# Patient Record
Sex: Female | Born: 1965
Health system: Southern US, Community
[De-identification: ages and names within clinical notes are randomized; demographics above are authoritative.]

## PROBLEM LIST (undated history)

## (undated) DIAGNOSIS — M8580 Other specified disorders of bone density and structure, unspecified site: Secondary | ICD-10-CM

## (undated) HISTORY — DX: Other specified disorders of bone density and structure, unspecified site: M85.80

---

## 2017-11-02 DIAGNOSIS — Z0000 Encounter for general adult medical examination without abnormal findings: Secondary | ICD-10-CM | POA: Diagnosis not present

## 2017-11-02 DIAGNOSIS — Z13220 Encounter for screening for lipoid disorders: Secondary | ICD-10-CM | POA: Diagnosis not present

## 2017-11-20 DIAGNOSIS — S2020XA Contusion of thorax, unspecified, initial encounter: Secondary | ICD-10-CM | POA: Diagnosis not present

## 2017-12-06 DIAGNOSIS — K625 Hemorrhage of anus and rectum: Secondary | ICD-10-CM | POA: Diagnosis not present

## 2017-12-06 DIAGNOSIS — D126 Benign neoplasm of colon, unspecified: Secondary | ICD-10-CM | POA: Diagnosis not present

## 2017-12-06 DIAGNOSIS — Z800 Family history of malignant neoplasm of digestive organs: Secondary | ICD-10-CM | POA: Diagnosis not present

## 2018-03-06 ENCOUNTER — Other Ambulatory Visit: Payer: Self-pay | Admitting: Obstetrics and Gynecology

## 2018-03-06 ENCOUNTER — Other Ambulatory Visit (HOSPITAL_COMMUNITY)
Admission: RE | Admit: 2018-03-06 | Discharge: 2018-03-06 | Disposition: A | Discharge status: Home / Self Care | Payer: 59 | Source: Ambulatory Visit | Attending: Obstetrics and Gynecology | Admitting: Obstetrics and Gynecology

## 2018-03-06 DIAGNOSIS — Z01411 Encounter for gynecological examination (general) (routine) with abnormal findings: Secondary | ICD-10-CM | POA: Diagnosis present

## 2018-03-07 LAB — CYTOLOGY - PAP
DIAGNOSIS: NEGATIVE
HPV: NOT DETECTED

## 2019-03-09 ENCOUNTER — Other Ambulatory Visit: Payer: Self-pay | Admitting: Obstetrics and Gynecology

## 2019-03-09 DIAGNOSIS — M8580 Other specified disorders of bone density and structure, unspecified site: Secondary | ICD-10-CM

## 2019-03-09 DIAGNOSIS — Z1231 Encounter for screening mammogram for malignant neoplasm of breast: Secondary | ICD-10-CM

## 2019-05-17 ENCOUNTER — Ambulatory Visit: Payer: 59

## 2019-05-17 ENCOUNTER — Other Ambulatory Visit: Payer: 59

## 2019-07-04 ENCOUNTER — Other Ambulatory Visit: Payer: 59

## 2019-07-04 ENCOUNTER — Ambulatory Visit: Payer: 59

## 2020-02-21 ENCOUNTER — Other Ambulatory Visit: Payer: Self-pay | Admitting: Family Medicine

## 2020-02-21 DIAGNOSIS — Z1231 Encounter for screening mammogram for malignant neoplasm of breast: Secondary | ICD-10-CM

## 2020-02-21 DIAGNOSIS — M8580 Other specified disorders of bone density and structure, unspecified site: Secondary | ICD-10-CM

## 2020-03-06 ENCOUNTER — Other Ambulatory Visit: Payer: Self-pay

## 2020-03-06 ENCOUNTER — Ambulatory Visit
Admission: RE | Admit: 2020-03-06 | Discharge: 2020-03-06 | Disposition: A | Discharge status: Home / Self Care | Payer: 59 | Source: Ambulatory Visit | Attending: Family Medicine | Admitting: Family Medicine

## 2020-03-06 DIAGNOSIS — Z1231 Encounter for screening mammogram for malignant neoplasm of breast: Secondary | ICD-10-CM

## 2020-05-16 ENCOUNTER — Other Ambulatory Visit: Payer: 59

## 2020-09-04 ENCOUNTER — Other Ambulatory Visit: Payer: 59

## 2021-05-16 IMAGING — MG DIGITAL SCREENING BILAT W/ TOMO W/ CAD
8 series · 8 of 24 positions shown · non-contrast
Comparison: None.
COMPARISON: None.

Addendum:
CLINICAL DATA: Screening.

EXAM:
DIGITAL SCREENING BILATERAL MAMMOGRAM WITH TOMO AND CAD

[L MLO synth-2D]
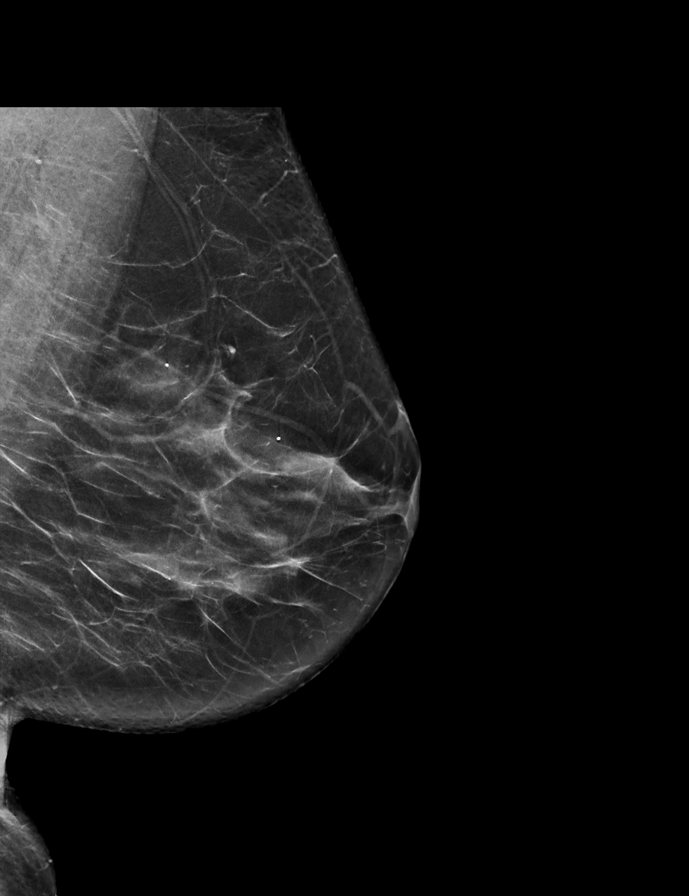

[R CC synth-2D]
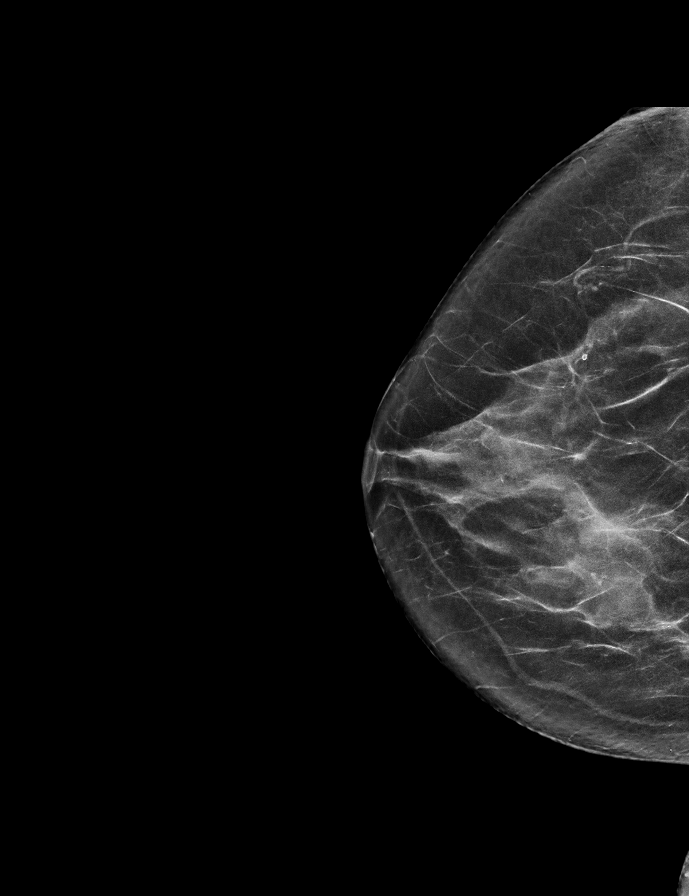

[L CC synth-2D]
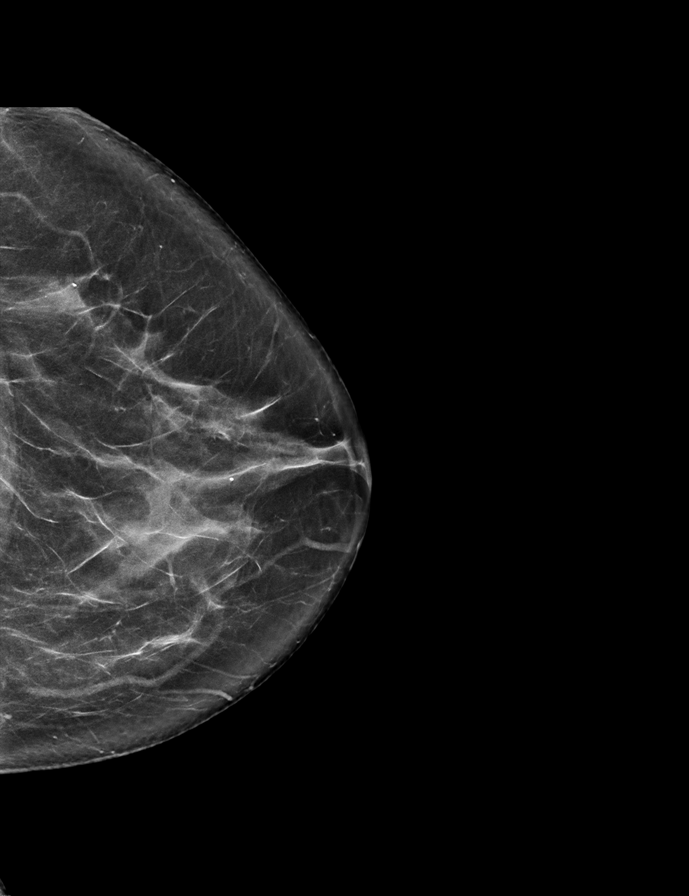

[R MLO synth-2D]
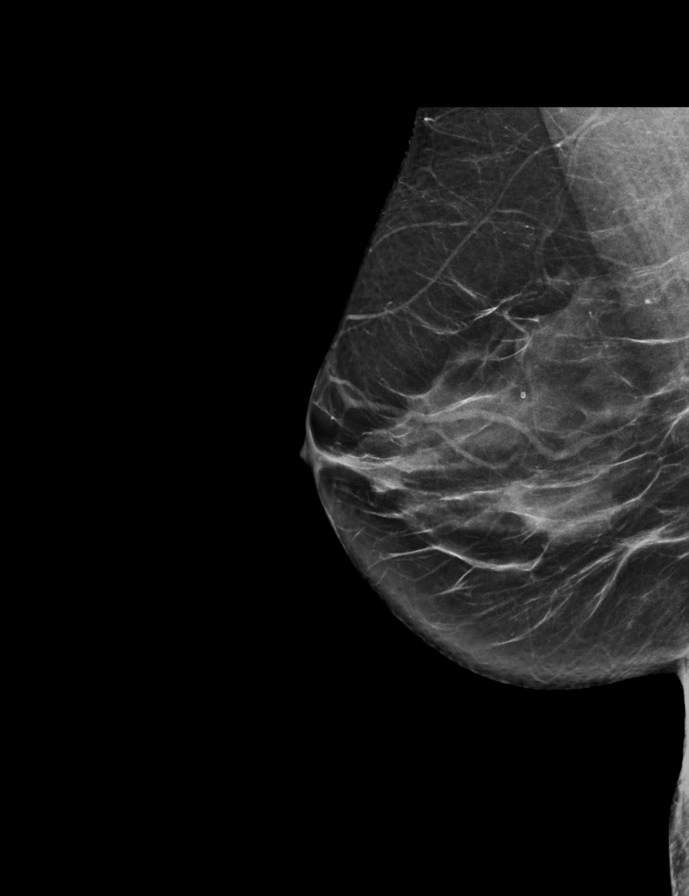

[R MLO tomo · tomo slice 31/62.0]
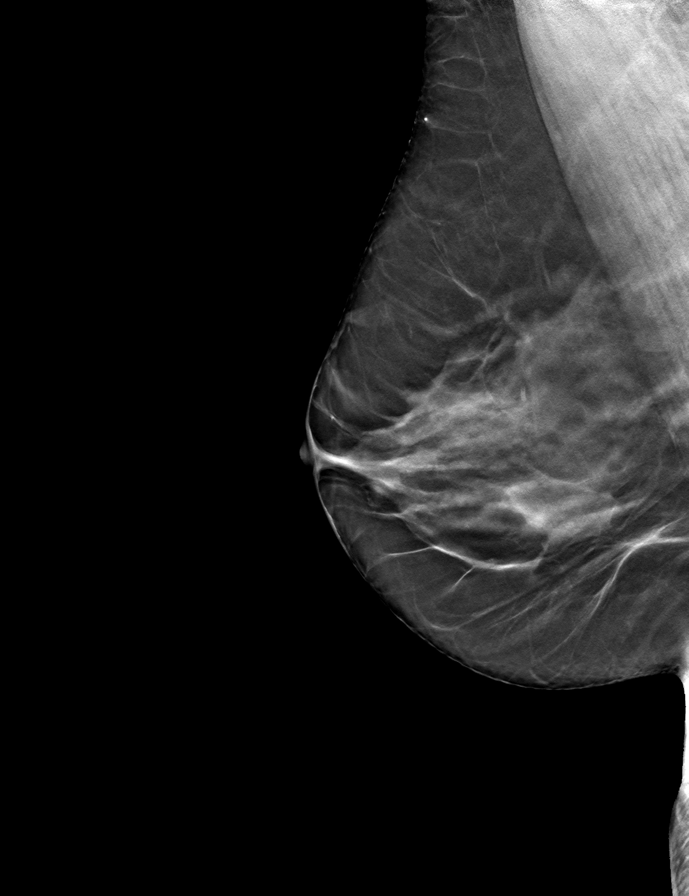

[R CC tomo · tomo slice 32/63.0]
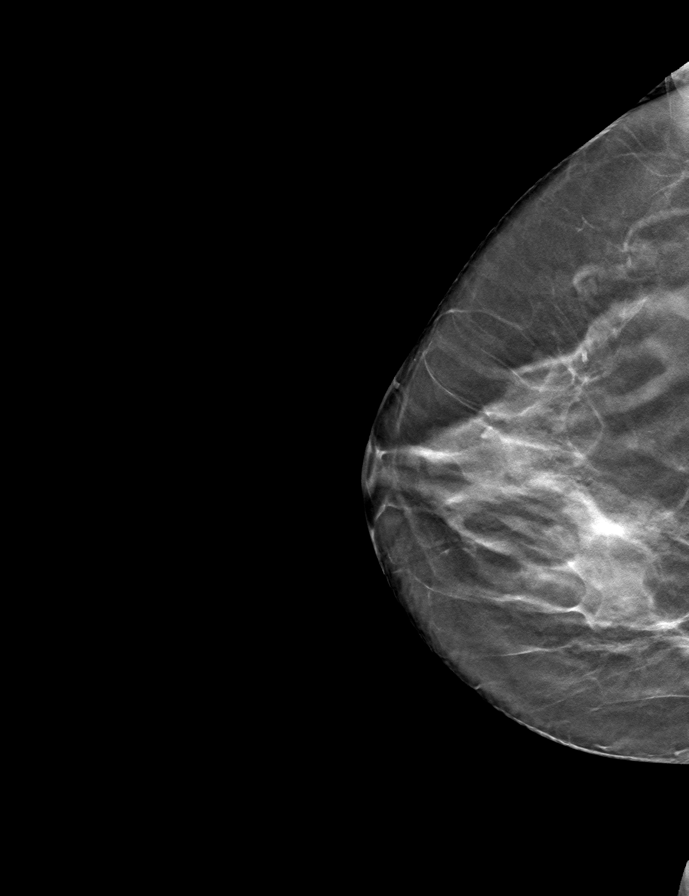

[L CC tomo · tomo slice 35/68.0]
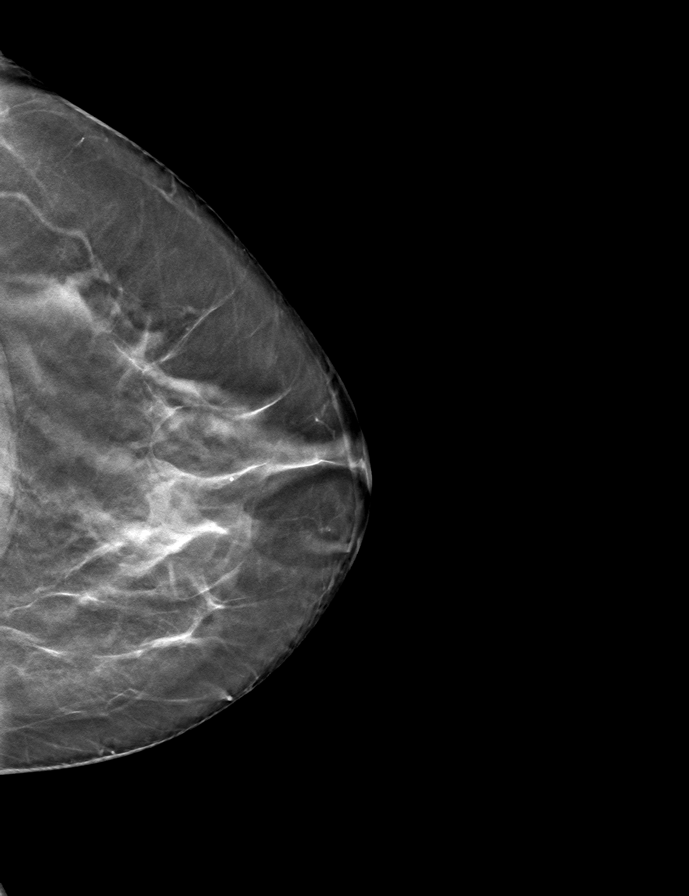

[L MLO tomo · tomo slice 34/67.0]
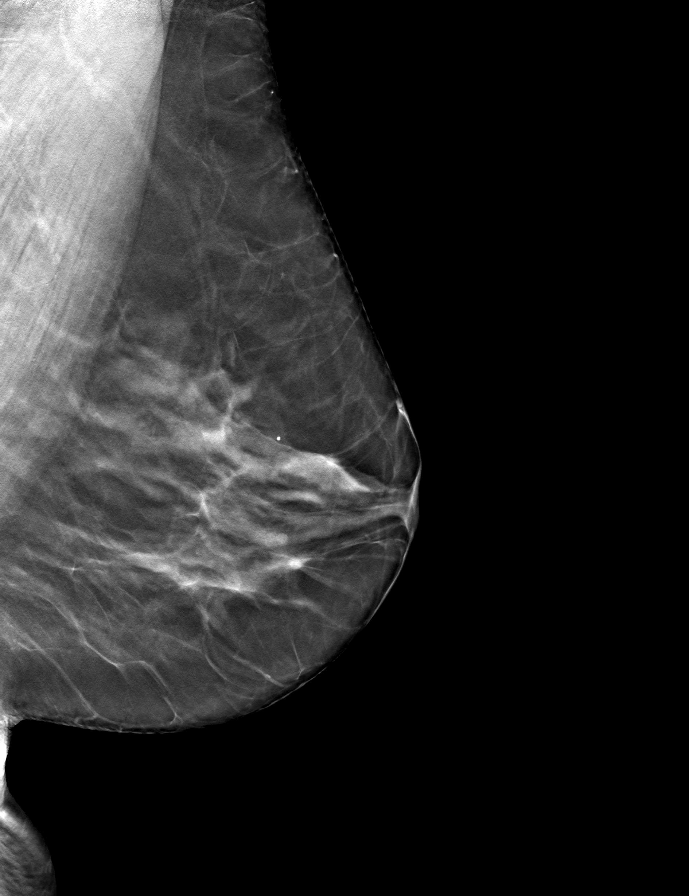

[8 of 24 positions shown; findings below may reference images not displayed]

ACR Breast Density Category c: The breast tissue is heterogeneously
dense, which may obscure small masses
FINDINGS: There are no findings suspicious for malignancy. Images were
processed with CAD.
IMPRESSION: No mammographic evidence of malignancy. A result letter of this
screening mammogram will be mailed directly to the patient.

RECOMMENDATION:
Screening mammogram in one year. (Code:BS-H-AYP)

BI-RADS CATEGORY  1: Negative.

ADDENDUM:
Prior outside mammograms have become available, dated 01/21/2017,
10/28/2009, 06/06/2008 and 12/05/2007. There has been no significant
change when compared to the prior exams. There is no mammographic
evidence of malignancy in either breast.

Recommendation: Screening mammogram in one year.(Code:BS-H-AYP)

BI-RADS category: 1: Negative.

*** End of Addendum ***
ACR Breast Density Category c: The breast tissue is heterogeneously
dense, which may obscure small masses
FINDINGS: There are no findings suspicious for malignancy. Images were
processed with CAD.
IMPRESSION: No mammographic evidence of malignancy. A result letter of this
screening mammogram will be mailed directly to the patient.

RECOMMENDATION:
Screening mammogram in one year. (Code:BS-H-AYP)

BI-RADS CATEGORY  1: Negative.

## 2021-09-24 ENCOUNTER — Other Ambulatory Visit: Payer: Self-pay

## 2021-09-24 ENCOUNTER — Encounter (HOSPITAL_BASED_OUTPATIENT_CLINIC_OR_DEPARTMENT_OTHER): Payer: Self-pay | Admitting: Family Medicine

## 2021-09-24 ENCOUNTER — Ambulatory Visit (INDEPENDENT_AMBULATORY_CARE_PROVIDER_SITE_OTHER): Payer: 59 | Admitting: Family Medicine

## 2021-09-24 VITALS — BP 116/68 | HR 91 | Ht 64.5 in | Wt 138.2 lb

## 2021-09-24 DIAGNOSIS — Z0000 Encounter for general adult medical examination without abnormal findings: Secondary | ICD-10-CM | POA: Diagnosis not present

## 2021-09-24 DIAGNOSIS — E559 Vitamin D deficiency, unspecified: Secondary | ICD-10-CM | POA: Diagnosis not present

## 2021-09-24 NOTE — Patient Instructions (Signed)
°  Medication Instructions:  Your physician recommends that you continue on your current medications as directed. Please refer to the Current Medication list given to you today. --If you need a refill on any your medications before your next appointment, please call your pharmacy first. If no refills are authorized on file call the office.-- Follow-Up: Your next appointment:   Your physician recommends that you schedule a follow-up appointment in: 1-2 MONTHS for CPE with Dr. de [place]  You will receive a text message or e-mail with a link to a survey about your care and experience with [place] today! We would greatly appreciate your feedback!   Thanks for letting us be apart of your health journey!!  Primary Care and Sports Medicine   Dr. Arlina Robes [place]   We encourage you to activate your patient portal called "MyChart".  Sign up information is provided on this After Visit Summary.  MyChart is used to connect with patients for Virtual Visits (Telemedicine).  Patients are able to view lab/test results, encounter notes, upcoming appointments, etc.  Non-urgent messages can be sent to your provider as well. To learn more about what you can do with MyChart, please visit --  [email or web address].

## 2021-09-24 NOTE — Assessment & Plan Note (Signed)
We will check vitamin D level with upcoming labs for wellness exam

## 2021-09-24 NOTE — Progress Notes (Signed)
New Patient Office Visit  Subjective:  Patient ID: Maria Rodriguez, female    DOB: 1966-07-31  Age: 56 y.o. MRN: [id number]  CC:  Chief Complaint  Patient presents with   Establish Care    [facility] - [facility]. Patient also sees [facility] and Dr. Hollie Rodriguez for Derm. Patient has no acute concerns or complaints today. She would like to have labs updated as she states it has been a few years.    HPI Maria Rodriguez is a 56 year old female presenting to establish in clinic.  Current concerns as outlined above.  Denies any significant past medical history.  She does report that she had a prior bone density scan that showed osteopenia, she thinks this was about 4 years ago, completed while she was living in [place].  Patient reports a history of vitamin D deficiency, has been taking vitamin D supplementation.  Not sure of current vitamin D levels, has not had labs in a couple years She would like to have wellness visit arranged and to complete baseline labs  She follows with dermatologist locally, Dr. Mardene Rodriguez with [facility]  She is originally from [place], [place].  Moved to the area about 3 to 4 years ago due to her husband relocating for work.  She works as a Oncologist for [employer]  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  History reviewed. No pertinent past medical history.  History reviewed. No pertinent surgical history.  Family History  Problem Relation Age of Onset   Cancer Mother        melanoma   Diabetes Mother    Cancer Father        colon   Heart failure Maternal Grandmother    Heart attack Maternal Grandfather    Stroke Paternal Grandmother    Cancer Paternal Grandfather        colon   Breast cancer Neg Hx     Social History   Socioeconomic History   Marital status: Married    Spouse name: Not on file   Number of children: Not on file   Years of education:  Not on file   Highest education level: Not on file  Occupational History   Not on file  Tobacco Use   Smoking status: Never   Smokeless tobacco: Never  Vaping Use   Vaping Use: Never used  Substance and Sexual Activity   Alcohol use: Yes    Alcohol/week: 1.0 standard drink    Types: 1 Glasses of wine per week   Drug use: Never   Sexual activity: Yes    Birth control/protection: None  Other Topics Concern   Not on file  Social History Narrative   Not on file   Social Determinants of Health   Financial Resource Strain: Not on file  Food Insecurity: Not on file  Transportation Needs: Not on file  Physical Activity: Not on file  Stress: Not on file  Social Connections: Not on file  Intimate Partner Violence: Not on file    Objective:   Today's Vitals: BP 116/68    Pulse 91    Ht 5' 4.5" (1.638 m)    Wt 138 lb 3.2 oz (62.7 kg)    SpO2 97%    BMI 23.36 kg/m   Physical Exam  56 year old female in no acute distress Cardiovascular exam with regular rate and rhythm, no murmur appreciated Lungs  clear to auscultation bilaterally  Assessment & Plan:   Problem List Items Addressed This Visit   None   Outpatient Encounter Medications as of 09/24/2021  Medication Sig   Ascorbic Acid (VITAMIN C) 500 MG CHEW 1 tablet   COLLAGEN PO Take 1 capsule by mouth daily.   Multiple Vitamins-Minerals (MULTI FOR HER) TABS 1 tablet   Vitamin D, Ergocalciferol, (DRISDOL) 1.25 MG (50000 UNIT) CAPS capsule 1 capsule   [DISCONTINUED] Emollient (COLLAGEN) CREA See admin instructions. (Patient not taking: Reported on 09/24/2021)   No facility-administered encounter medications on file as of 09/24/2021.    Follow-up: No follow-ups on file.  Plan for follow-up in about 1 to 2 months to complete CPE, will complete labs at nurse visit about 1 week prior  Hewitt Garner J De [place], MD

## 2021-10-13 ENCOUNTER — Ambulatory Visit (HOSPITAL_BASED_OUTPATIENT_CLINIC_OR_DEPARTMENT_OTHER): Payer: 59 | Admitting: Family Medicine

## 2021-10-13 ENCOUNTER — Encounter (HOSPITAL_BASED_OUTPATIENT_CLINIC_OR_DEPARTMENT_OTHER): Payer: Self-pay | Admitting: Family Medicine

## 2021-10-13 ENCOUNTER — Other Ambulatory Visit: Payer: Self-pay

## 2021-10-13 DIAGNOSIS — J329 Chronic sinusitis, unspecified: Secondary | ICD-10-CM | POA: Insufficient documentation

## 2021-10-13 DIAGNOSIS — J019 Acute sinusitis, unspecified: Secondary | ICD-10-CM | POA: Diagnosis not present

## 2021-10-13 MED ORDER — AMOXICILLIN 875 MG PO TABS: 875.0000 mg | ORAL_TABLET | Freq: Two times a day (BID) | ORAL | 0 refills | Status: AC

## 2021-10-13 MED ORDER — BENZONATATE 200 MG PO CAPS: 200.0000 mg | ORAL_CAPSULE | Freq: Three times a day (TID) | ORAL | 0 refills | Status: DC | PRN

## 2021-10-13 NOTE — Progress Notes (Signed)
? ? ?  Procedures performed today:   ? ?None. ? ?Independent interpretation of notes and tests performed by another provider:  ? ?None. ? ?Brief History, Exam, Impression, and Recommendations:   ? ?BP 117/72   Pulse 96   Ht '5\' 5"'$  (1.651 m)   Wt 136 lb 3.2 oz (61.8 kg)   SpO2 96%   BMI 22.66 kg/m?  ? ?Sinusitis ?Patient presents for evaluation of 1 week history of head pressure, cough, sore throat.  Generally symptoms have been unchanged since they started.  She has not had any associated fevers that she is aware of, however has had some chills, particularly this morning.  She has had intermittent coughing, worse at night.  Mild shortness of breath and coughing, however no significant dyspnea with home activities or ADLs.  She did complete an at-home COVID test this morning which was negative.  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  She has been trying to use OTC medications to help with symptoms with minimal relief. ?On exam, does have some frontal sinus tenderness.  Lungs are clear to auscultation bilaterally, heart with regular rate and rhythm.  No significant pharyngeal erythema, no exudates. ?Suspect sinusitis, given duration of symptoms, likely transition to bacterial infection, will treat with 7-day course of amoxicillin, sent to pharmacy on file ?We will also send a prescription for Tessalon Perles given ongoing cough.  Also advised that she can utilize honey to try to help with symptoms ?Can continue with conservative measures ?We will provide note for her to remain home from work tomorrow given that she feels that she has some chills, possible fever earlier today ?Plan for follow-up as needed ? ? ?___________________________________________ ?Dare Sanger de [place], MD, ABFM, CAQSM ?Primary Care and Sports Medicine ?[place] ?

## 2021-10-13 NOTE — Assessment & Plan Note (Signed)
Patient presents for evaluation of 1 week history of head pressure, cough, sore throat.  Generally symptoms have been unchanged since they started.  She has not had any associated fevers that she is aware of, however has had some chills, particularly this morning.  She has had intermittent coughing, worse at night.  Mild shortness of breath and coughing, however no significant dyspnea with home activities or ADLs.  She did complete an at-home COVID test this morning which was negative.  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  She has been trying to use OTC medications to help with symptoms with minimal relief. ?On exam, does have some frontal sinus tenderness.  Lungs are clear to auscultation bilaterally, heart with regular rate and rhythm.  No significant pharyngeal erythema, no exudates. ?Suspect sinusitis, given duration of symptoms, likely transition to bacterial infection, will treat with 7-day course of amoxicillin, sent to pharmacy on file ?We will also send a prescription for Tessalon Perles given ongoing cough.  Also advised that she can utilize honey to try to help with symptoms ?Can continue with conservative measures ?We will provide note for her to remain home from work tomorrow given that she feels that she has some chills, possible fever earlier today ?Plan for follow-up as needed ?

## 2021-10-13 NOTE — Patient Instructions (Signed)
?  Medications have been called into your pharmacy.  ? ? ? ? ?Follow-Up: ?Your next appointment:   ?Your physician recommends that you schedule a follow-up appointment in: PRN with Dr. de [place] ? ?You will receive a text message or e-mail with a link to a survey about your care and experience with [place] today! We would greatly appreciate your feedback!  ? ?Thanks for letting us be apart of your health journey!!  ?Primary Care and Sports Medicine  ? ?Dr. Kyung Rudd de [place]  ? ?We encourage you to activate your patient portal called "MyChart".  Sign up information is provided on this After Visit Summary.  MyChart is used to connect with patients for Virtual Visits (Telemedicine).  Patients are able to view lab/test results, encounter notes, upcoming appointments, etc.  Non-urgent messages can be sent to your provider as well. To learn more about what you can do with MyChart, please visit --  [email or web address].   ? ?

## 2021-11-06 ENCOUNTER — Ambulatory Visit (HOSPITAL_BASED_OUTPATIENT_CLINIC_OR_DEPARTMENT_OTHER): Payer: 59

## 2021-11-06 ENCOUNTER — Other Ambulatory Visit (HOSPITAL_BASED_OUTPATIENT_CLINIC_OR_DEPARTMENT_OTHER): Payer: Self-pay | Admitting: Family Medicine

## 2021-11-07 LAB — LIPID PANEL
Chol/HDL Ratio: 3 ratio (ref 0.0–4.4)
Cholesterol, Total: 178 mg/dL (ref 100–199)
HDL: 59 mg/dL (ref >39)
LDL Chol Calc (NIH): 109 mg/dL — ABNORMAL HIGH (ref 0–99)
Triglycerides: 53 mg/dL (ref 0–149)
VLDL Cholesterol Cal: 10 mg/dL (ref 5–40)

## 2021-11-07 LAB — CBC WITH DIFFERENTIAL/PLATELET
Basophils Absolute: 0 10*3/uL (ref 0.0–0.2)
Basos: 1 %
EOS (ABSOLUTE): 0.1 10*3/uL (ref 0.0–0.4)
Eos: 3 %
Hematocrit: 38.7 % (ref 34.0–46.6)
Hemoglobin: 13 g/dL (ref 11.1–15.9)
Immature Grans (Abs): 0 10*3/uL (ref 0.0–0.1)
Immature Granulocytes: 0 %
Lymphocytes Absolute: 1.7 10*3/uL (ref 0.7–3.1)
Lymphs: 45 %
MCH: 31.4 pg (ref 26.6–33.0)
MCHC: 33.6 g/dL (ref 31.5–35.7)
MCV: 94 fL (ref 79–97)
Monocytes Absolute: 0.3 10*3/uL (ref 0.1–0.9)
Monocytes: 9 %
Neutrophils Absolute: 1.5 10*3/uL (ref 1.4–7.0)
Neutrophils: 42 %
Platelets: 217 10*3/uL (ref 150–450)
RBC: 4.14 x10E6/uL (ref 3.77–5.28)
RDW: 12.8 % (ref 11.7–15.4)
WBC: 3.7 10*3/uL (ref 3.4–10.8)

## 2021-11-07 LAB — VITAMIN D 25 HYDROXY (VIT D DEFICIENCY, FRACTURES): Vit D, 25-Hydroxy: 25.1 ng/mL — ABNORMAL LOW (ref 30.0–100.0)

## 2021-11-07 LAB — COMPREHENSIVE METABOLIC PANEL
ALT: 12 IU/L (ref 0–32)
AST: 21 IU/L (ref 0–40)
Albumin/Globulin Ratio: 2 (ref 1.2–2.2)
Albumin: 4.2 g/dL (ref 3.8–4.9)
Alkaline Phosphatase: 83 IU/L (ref 44–121)
BUN/Creatinine Ratio: 14 (ref 9–23)
BUN: 11 mg/dL (ref 6–24)
Bilirubin Total: 0.4 mg/dL (ref 0.0–1.2)
CO2: 21 mmol/L (ref 20–29)
Calcium: 9.1 mg/dL (ref 8.7–10.2)
Chloride: 106 mmol/L (ref 96–106)
Creatinine, Ser: 0.76 mg/dL (ref 0.57–1.00)
Globulin, Total: 2.1 g/dL (ref 1.5–4.5)
Glucose: 99 mg/dL (ref 70–99)
Potassium: 4.3 mmol/L (ref 3.5–5.2)
Sodium: 138 mmol/L (ref 134–144)
Total Protein: 6.3 g/dL (ref 6.0–8.5)
eGFR: 92 mL/min/{1.73_m2} (ref >59)

## 2021-11-07 LAB — TSH RFX ON ABNORMAL TO FREE T4: TSH: 0.913 u[IU]/mL (ref 0.450–4.500)

## 2021-11-12 ENCOUNTER — Encounter (HOSPITAL_BASED_OUTPATIENT_CLINIC_OR_DEPARTMENT_OTHER): Payer: Self-pay | Admitting: Family Medicine

## 2021-11-12 ENCOUNTER — Ambulatory Visit (INDEPENDENT_AMBULATORY_CARE_PROVIDER_SITE_OTHER): Payer: 59 | Admitting: Family Medicine

## 2021-11-12 VITALS — BP 108/56 | HR 94 | Temp 97.8°F | Ht 65.0 in | Wt 137.8 lb

## 2021-11-12 DIAGNOSIS — Z780 Asymptomatic menopausal state: Secondary | ICD-10-CM | POA: Insufficient documentation

## 2021-11-12 DIAGNOSIS — E559 Vitamin D deficiency, unspecified: Secondary | ICD-10-CM

## 2021-11-12 DIAGNOSIS — F439 Reaction to severe stress, unspecified: Secondary | ICD-10-CM | POA: Insufficient documentation

## 2021-11-12 DIAGNOSIS — Z0000 Encounter for general adult medical examination without abnormal findings: Secondary | ICD-10-CM

## 2021-11-12 DIAGNOSIS — Z1211 Encounter for screening for malignant neoplasm of colon: Secondary | ICD-10-CM

## 2021-11-12 DIAGNOSIS — K644 Residual hemorrhoidal skin tags: Secondary | ICD-10-CM | POA: Insufficient documentation

## 2021-11-12 DIAGNOSIS — N952 Postmenopausal atrophic vaginitis: Secondary | ICD-10-CM | POA: Insufficient documentation

## 2021-11-12 DIAGNOSIS — K5904 Chronic idiopathic constipation: Secondary | ICD-10-CM | POA: Insufficient documentation

## 2021-11-12 DIAGNOSIS — Z23 Encounter for immunization: Secondary | ICD-10-CM

## 2021-11-12 NOTE — Assessment & Plan Note (Signed)
Vit D slightly low on recent labs, she has been doing 3000 IU, will increase to 6000 IU and recheck in 3 months ?

## 2021-11-12 NOTE — Progress Notes (Signed)
Labs reviewed in office with patient during CPE appointment. AS, CMA ?

## 2021-11-12 NOTE — Progress Notes (Signed)
?Subjective:   ? ?CC: Annual Physical Exam ? ?HPI:  ?Maria Rodriguez is a 56 y.o. presenting for annual physical ? ?I reviewed the past medical history, family history, social history, surgical history, and allergies today and no changes were needed.  Please see the problem list section below in epic for further details. ? ?Past Medical History: ?History reviewed. No pertinent past medical history. ?Past Surgical History: ?History reviewed. No pertinent surgical history. ?Social History: ?Social History  ? ?Socioeconomic History  ? Marital status: Married  ?  Spouse name: Not on file  ? Number of children: Not on file  ? Years of education: Not on file  ? Highest education level: Not on file  ?Occupational History  ? Not on file  ?Tobacco Use  ? Smoking status: Never  ? Smokeless tobacco: Never  ?Vaping Use  ? Vaping Use: Never used  ?Substance and Sexual Activity  ? Alcohol use: Yes  ?  Alcohol/week: 1.0 standard drink  ?  Types: 1 Glasses of wine per week  ? Drug use: Never  ? Sexual activity: Yes  ?  Birth control/protection: None  ?Other Topics Concern  ? Not on file  ?Social History Narrative  ? Not on file  ? ?Social Determinants of Health  ? ?Financial Resource Strain: Not on file  ?Food Insecurity: Not on file  ?Transportation Needs: Not on file  ?Physical Activity: Not on file  ?Stress: Not on file  ?Social Connections: Not on file  ? ?Family History: ?Family History  ?Problem Relation Age of Onset  ? Cancer Mother   ?     melanoma  ? Diabetes Mother   ? Cancer Father   ?     colon  ? Heart failure Maternal Grandmother   ? Heart attack Maternal Grandfather   ? Stroke Paternal Grandmother   ? Cancer Paternal Grandfather   ?     colon  ? Breast cancer Neg Hx   ? ?Allergies: ?Allergies  ?Allergen Reactions  ? Tetracycline Hcl Other (See Comments)  ?  GI upset  ? ?Medications: See med rec. ? ?Review of Systems: No headache, visual changes, nausea, vomiting, diarrhea, constipation, dizziness, abdominal pain, skin  rash, fevers, chills, night sweats, swollen lymph nodes, weight loss, chest pain, body aches, joint swelling, muscle aches, shortness of breath, mood changes, visual or auditory hallucinations. ? ?Objective:   ? ?BP (!) 108/56   Pulse 94   Temp 97.8 ?F (36.6 ?C)   Ht '5\' 5"'$  (1.651 m)   Wt 137 lb 12.8 oz (62.5 kg)   SpO2 99%   BMI 22.93 kg/m?  ? ?General: Well Developed, well nourished, and in no acute distress.  ?Neuro: Alert and oriented x3, extra-ocular muscles intact, sensation grossly intact. Cranial nerves II through XII are intact, motor, sensory, and coordinative functions are all intact. ?HEENT: Normocephalic, atraumatic, pupils equal round reactive to light, neck supple, no masses, no lymphadenopathy, thyroid nonpalpable. Oropharynx, nasopharynx, external ear canals are unremarkable. ?Skin: Warm and dry, no rashes noted.  ?Cardiac: Regular rate and rhythm, no murmurs rubs or gallops.  ?Respiratory: Clear to auscultation bilaterally. Not using accessory muscles, speaking in full sentences.  ?Abdominal: Soft, nontender, nondistended, positive bowel sounds, no masses, no organomegaly.  ?Musculoskeletal: Shoulder, elbow, wrist, hip, knee, ankle stable, and with full range of motion. ? ?Impression and Recommendations:   ? ?Wellness examination ?Routine HCM labs reviewed. HCM reviewed/discussed. Anticipatory guidance regarding healthy weight, lifestyle and choices given. ?Recommend healthy diet.  Recommend approximately  150 minutes/week of moderate intensity exercise ?Recommend regular dental and vision exams ?Always use seatbelt/lap and shoulder restraints ?Recommend using smoke alarms and checking batteries at least twice a year ?Recommend using sunscreen when outside ?Has had prior colonoscopy, thinks she is due, prior with [facility] - referral placed for repeat colonoscopy ?Has PAP completed with GYN - will schedule for later in the year ?Due for tetanus, administered today ?Due for Shingrix, would like  to hold off for now ? ?Vitamin D deficiency ?Vit D slightly low on recent labs, she has been doing 3000 IU, will increase to 6000 IU and recheck in 3 months ? ?Plan for follow-up in 3 months to review Vit D def - check labs a few days before ? ? ?___________________________________________ ?Nastasha Reising de [place], MD, ABFM, CAQSM ?Primary Care and Sports Medicine ?[place] ?

## 2021-11-12 NOTE — Patient Instructions (Signed)

## 2021-11-12 NOTE — Assessment & Plan Note (Addendum)
Routine HCM labs reviewed. HCM reviewed/discussed. Anticipatory guidance regarding healthy weight, lifestyle and choices given. ?Recommend healthy diet.  Recommend approximately 150 minutes/week of moderate intensity exercise ?Recommend regular dental and vision exams ?Always use seatbelt/lap and shoulder restraints ?Recommend using smoke alarms and checking batteries at least twice a year ?Recommend using sunscreen when outside ?Has had prior colonoscopy, thinks she is due, prior with [facility] - referral placed for repeat colonoscopy ?Has PAP completed with GYN - will schedule for later in the year ?Due for tetanus, administered today ?Due for Shingrix, would like to hold off for now ?

## 2022-01-21 ENCOUNTER — Ambulatory Visit (HOSPITAL_BASED_OUTPATIENT_CLINIC_OR_DEPARTMENT_OTHER): Payer: 59 | Admitting: Obstetrics & Gynecology

## 2022-01-28 ENCOUNTER — Ambulatory Visit (HOSPITAL_BASED_OUTPATIENT_CLINIC_OR_DEPARTMENT_OTHER): Payer: 59

## 2022-03-15 ENCOUNTER — Other Ambulatory Visit: Payer: Self-pay | Admitting: Nurse Practitioner

## 2022-03-15 DIAGNOSIS — Z1231 Encounter for screening mammogram for malignant neoplasm of breast: Secondary | ICD-10-CM

## 2022-03-18 LAB — HM MAMMOGRAPHY

## 2022-03-18 LAB — HM PAP SMEAR

## 2022-03-25 ENCOUNTER — Ambulatory Visit
Admission: RE | Admit: 2022-03-25 | Discharge: 2022-03-25 | Disposition: A | Discharge status: Home / Self Care | Payer: 59 | Source: Ambulatory Visit | Attending: Nurse Practitioner | Admitting: Nurse Practitioner

## 2022-03-25 DIAGNOSIS — Z1231 Encounter for screening mammogram for malignant neoplasm of breast: Secondary | ICD-10-CM

## 2022-10-20 ENCOUNTER — Encounter (HOSPITAL_BASED_OUTPATIENT_CLINIC_OR_DEPARTMENT_OTHER): Payer: Self-pay

## 2024-02-09 ENCOUNTER — Encounter: Payer: Self-pay | Admitting: Internal Medicine

## 2024-02-09 ENCOUNTER — Ambulatory Visit: Admitting: Internal Medicine

## 2024-02-09 VITALS — BP 124/68 | HR 80 | Temp 98.6°F | Ht 64.0 in | Wt 140.5 lb

## 2024-02-09 DIAGNOSIS — Z13220 Encounter for screening for lipoid disorders: Secondary | ICD-10-CM

## 2024-02-09 DIAGNOSIS — Z0000 Encounter for general adult medical examination without abnormal findings: Secondary | ICD-10-CM | POA: Diagnosis not present

## 2024-02-09 DIAGNOSIS — Z13820 Encounter for screening for osteoporosis: Secondary | ICD-10-CM | POA: Diagnosis not present

## 2024-02-09 DIAGNOSIS — Z1159 Encounter for screening for other viral diseases: Secondary | ICD-10-CM

## 2024-02-09 DIAGNOSIS — Z131 Encounter for screening for diabetes mellitus: Secondary | ICD-10-CM

## 2024-02-09 DIAGNOSIS — Z114 Encounter for screening for human immunodeficiency virus [HIV]: Secondary | ICD-10-CM | POA: Diagnosis not present

## 2024-02-09 DIAGNOSIS — Z124 Encounter for screening for malignant neoplasm of cervix: Secondary | ICD-10-CM

## 2024-02-09 DIAGNOSIS — Z1231 Encounter for screening mammogram for malignant neoplasm of breast: Secondary | ICD-10-CM

## 2024-02-09 LAB — CBC WITH DIFFERENTIAL/PLATELET
Basophils Absolute: 0 10*3/uL (ref 0.0–0.1)
Basophils Relative: 0.6 % (ref 0.0–3.0)
Eosinophils Absolute: 0.1 10*3/uL (ref 0.0–0.7)
Eosinophils Relative: 1.3 % (ref 0.0–5.0)
HCT: 41.2 % (ref 36.0–46.0)
Hemoglobin: 13.9 g/dL (ref 12.0–15.0)
Lymphocytes Relative: 35.3 % (ref 12.0–46.0)
Lymphs Abs: 1.7 10*3/uL (ref 0.7–4.0)
MCHC: 33.7 g/dL (ref 30.0–36.0)
MCV: 93.7 fl (ref 78.0–100.0)
Monocytes Absolute: 0.3 10*3/uL (ref 0.1–1.0)
Monocytes Relative: 5.7 % (ref 3.0–12.0)
Neutro Abs: 2.8 10*3/uL (ref 1.4–7.7)
Neutrophils Relative %: 57.1 % (ref 43.0–77.0)
Platelets: 255 10*3/uL (ref 150.0–400.0)
RBC: 4.39 Mil/uL (ref 3.87–5.11)
RDW: 13 % (ref 11.5–15.5)
WBC: 4.9 10*3/uL (ref 4.0–10.5)

## 2024-02-09 LAB — LIPID PANEL
Cholesterol: 228 mg/dL — ABNORMAL HIGH (ref 0–200)
HDL: 69.4 mg/dL (ref >39.00)
LDL Cholesterol: 136 mg/dL — ABNORMAL HIGH (ref 0–99)
NonHDL: 158.79
Total CHOL/HDL Ratio: 3
Triglycerides: 114 mg/dL (ref 0.0–149.0)
VLDL: 22.8 mg/dL (ref 0.0–40.0)

## 2024-02-09 LAB — COMPREHENSIVE METABOLIC PANEL WITH GFR
ALT: 18 U/L (ref 0–35)
AST: 30 U/L (ref 0–37)
Albumin: 4.7 g/dL (ref 3.5–5.2)
Alkaline Phosphatase: 59 U/L (ref 39–117)
BUN: 20 mg/dL (ref 6–23)
CO2: 27 meq/L (ref 19–32)
Calcium: 9.6 mg/dL (ref 8.4–10.5)
Chloride: 105 meq/L (ref 96–112)
Creatinine, Ser: 0.86 mg/dL (ref 0.40–1.20)
GFR: 74.1 mL/min (ref >60.00)
Glucose, Bld: 86 mg/dL (ref 70–99)
Potassium: 4 meq/L (ref 3.5–5.1)
Sodium: 139 meq/L (ref 135–145)
Total Bilirubin: 0.6 mg/dL (ref 0.2–1.2)
Total Protein: 7.5 g/dL (ref 6.0–8.3)

## 2024-02-09 LAB — VITAMIN B12: Vitamin B-12: 289 pg/mL (ref 211–911)

## 2024-02-09 LAB — TSH: TSH: 0.86 u[IU]/mL (ref 0.35–5.50)

## 2024-02-09 LAB — VITAMIN D 25 HYDROXY (VIT D DEFICIENCY, FRACTURES): VITD: 29.07 ng/mL — ABNORMAL LOW (ref 30.00–100.00)

## 2024-02-09 LAB — HEMOGLOBIN A1C: Hgb A1c MFr Bld: 5.6 % (ref 4.6–6.5)

## 2024-02-09 NOTE — Progress Notes (Signed)
 Established Patient Office Visit     CC/Reason for Visit: Establish care, annual preventive exam  HPI: Maria Rodriguez is a 58 y.o. female who is coming in today for the above mentioned reasons.  No past medical history of significance.  Is overdue for mammogram and GYN exam.  Had a colonoscopy in 2024 and is due in 5 years due to family history.  She is due for shingles vaccination.   Past Medical/Surgical History: History reviewed. No pertinent past medical history.  History reviewed. No pertinent surgical history.  Social History:  reports that she has never smoked. She has never used smokeless tobacco. She reports current alcohol use of about 1.0 standard drink of alcohol per week. She reports that she does not use drugs.  Allergies: Allergies  Allergen Reactions   Tetracycline Hcl Other (See Comments)    GI upset    Family History:  Family History  Problem Relation Age of Onset   Cancer Mother        melanoma   Diabetes Mother    Cancer Father        colon   Heart failure Maternal Grandmother    Heart attack Maternal Grandfather    Stroke Paternal Grandmother    Cancer Paternal Grandfather        colon   Breast cancer Neg Hx      Current Outpatient Medications:    Ascorbic Acid (VITAMIN C) 500 MG CHEW, 1 tablet, Disp: , Rfl:    Cholecalciferol (VITAMIN D3) 75 MCG (3000 UT) TABS, Take by mouth., Disp: , Rfl:    COLLAGEN PO, Take 1 capsule by mouth daily., Disp: , Rfl:    Elderberry 500 MG CAPS, Take by mouth., Disp: , Rfl:    Multiple Vitamins-Minerals (MULTI FOR HER) TABS, 1 tablet, Disp: , Rfl:    Saccharomyces boulardii (PROBIOTIC) 250 MG CAPS, Take by mouth., Disp: , Rfl:   Review of Systems:  Negative unless indicated in HPI.   Physical Exam: Vitals:   02/09/24 1256  BP: 124/68  Pulse: 80  Temp: 98.6 F (37 C)  TempSrc: Oral  SpO2: 97%  Weight: 140 lb 8 oz (63.7 kg)  Height: 5' 4 (1.626 m)    Body mass index is 24.12  kg/m.   Physical Exam Vitals reviewed.  Constitutional:      General: She is not in acute distress.    Appearance: Normal appearance. She is not ill-appearing, toxic-appearing or diaphoretic.  HENT:     Head: Normocephalic.     Right Ear: Tympanic membrane, ear canal and external ear normal. There is no impacted cerumen.     Left Ear: Tympanic membrane, ear canal and external ear normal. There is no impacted cerumen.     Nose: Nose normal.     Mouth/Throat:     Mouth: Mucous membranes are moist.     Pharynx: Oropharynx is clear. No oropharyngeal exudate or posterior oropharyngeal erythema.  Eyes:     General: No scleral icterus.       Right eye: No discharge.        Left eye: No discharge.     Conjunctiva/sclera: Conjunctivae normal.     Pupils: Pupils are equal, round, and reactive to light.  Neck:     Vascular: No carotid bruit.  Cardiovascular:     Rate and Rhythm: Normal rate and regular rhythm.     Pulses: Normal pulses.     Heart sounds: Normal heart sounds.  Pulmonary:  Effort: Pulmonary effort is normal. No respiratory distress.     Breath sounds: Normal breath sounds.  Abdominal:     General: Abdomen is flat. Bowel sounds are normal.     Palpations: Abdomen is soft.  Musculoskeletal:        General: Normal range of motion.     Cervical back: Normal range of motion.  Skin:    General: Skin is warm and dry.  Neurological:     General: No focal deficit present.     Mental Status: She is alert and oriented to person, place, and time. Mental status is at baseline.  Psychiatric:        Mood and Affect: Mood normal.        Behavior: Behavior normal.        Thought Content: Thought content normal.        Judgment: Judgment normal.     Flowsheet Row Office Visit from 02/09/2024 in [facility]  PHQ-9 Total Score 0     Impression and Plan:  Encounter for screening mammogram for malignant neoplasm of breast -     Digital  Screening Mammogram, Left and Right; Future  Screening for osteoporosis  Encounter for preventive health examination -     VITAMIN D 25 Hydroxy (Vit-D Deficiency, Fractures); Future -     Vitamin B12; Future -     TSH; Future -     Lipid panel; Future -     Hemoglobin A1c; Future -     Comprehensive metabolic panel with GFR; Future -     CBC with Differential/Platelet; Future  Encounter for hepatitis C screening test for low risk patient -     Hepatitis C antibody; Future  Encounter for screening for HIV -     HIV Antibody (routine testing w rflx); Future  Screening for cervical cancer -     Ambulatory referral to Gynecology   -Recommend routine eye and dental care. -Healthy lifestyle discussed in detail. -Labs to be updated today. -Prostate cancer screening: Not applicable Health Maintenance  Topic Date Due   HIV Screening  Never done   Hepatitis C Screening  Never done   Hepatitis B Vaccine (1 of 3 - 19+ 3-dose series) Never done   Zoster (Shingles) Vaccine (1 of 2) Never done   Pap with HPV screening  03/07/2023   COVID-19 Vaccine (3 - 2024-25 season) 04/10/2023   Flu Shot  03/09/2024   Mammogram  03/25/2024   DTaP/Tdap/Td vaccine (2 - Td or Tdap) 11/13/2031   Colon Cancer Screening  03/21/2033   HPV Vaccine  Aged Out   Meningitis B Vaccine  Aged Out     -Declines shingles vaccine today but willing to get at another date.   Maria Theophilus Andrews, MD [facility]

## 2024-02-10 LAB — HIV ANTIBODY (ROUTINE TESTING W REFLEX): HIV 1&2 Ab, 4th Generation: NONREACTIVE

## 2024-02-10 LAB — HEPATITIS C ANTIBODY: Hepatitis C Ab: NONREACTIVE

## 2024-02-14 ENCOUNTER — Ambulatory Visit: Payer: Self-pay | Admitting: Internal Medicine

## 2024-02-14 DIAGNOSIS — E785 Hyperlipidemia, unspecified: Secondary | ICD-10-CM | POA: Insufficient documentation

## 2024-02-14 DIAGNOSIS — E782 Mixed hyperlipidemia: Secondary | ICD-10-CM

## 2024-02-14 DIAGNOSIS — E559 Vitamin D deficiency, unspecified: Secondary | ICD-10-CM

## 2024-02-14 MED ORDER — VITAMIN D (ERGOCALCIFEROL) 1.25 MG (50000 UNIT) PO CAPS: 50000.0000 [IU] | ORAL_CAPSULE | ORAL | 0 refills | Status: AC

## 2024-02-20 ENCOUNTER — Encounter: Payer: Self-pay | Admitting: Internal Medicine

## 2024-03-07 ENCOUNTER — Ambulatory Visit
Admission: RE | Admit: 2024-03-07 | Discharge: 2024-03-07 | Disposition: A | Discharge status: Home / Self Care | Source: Ambulatory Visit | Attending: Internal Medicine | Admitting: Internal Medicine

## 2024-03-07 DIAGNOSIS — Z1231 Encounter for screening mammogram for malignant neoplasm of breast: Secondary | ICD-10-CM

## 2024-03-20 ENCOUNTER — Ambulatory Visit (INDEPENDENT_AMBULATORY_CARE_PROVIDER_SITE_OTHER): Admitting: Radiology

## 2024-03-20 ENCOUNTER — Other Ambulatory Visit (HOSPITAL_COMMUNITY)
Admission: RE | Admit: 2024-03-20 | Discharge: 2024-03-20 | Disposition: A | Discharge status: Home / Self Care | Source: Ambulatory Visit | Attending: Radiology | Admitting: Radiology

## 2024-03-20 ENCOUNTER — Encounter: Payer: Self-pay | Admitting: Radiology

## 2024-03-20 VITALS — BP 102/66 | HR 81 | Ht 64.75 in | Wt 141.0 lb

## 2024-03-20 DIAGNOSIS — Z1331 Encounter for screening for depression: Secondary | ICD-10-CM

## 2024-03-20 DIAGNOSIS — Z01419 Encounter for gynecological examination (general) (routine) without abnormal findings: Secondary | ICD-10-CM | POA: Diagnosis not present

## 2024-03-20 DIAGNOSIS — N951 Menopausal and female climacteric states: Secondary | ICD-10-CM | POA: Diagnosis not present

## 2024-03-20 DIAGNOSIS — M8580 Other specified disorders of bone density and structure, unspecified site: Secondary | ICD-10-CM | POA: Diagnosis not present

## 2024-03-20 MED ORDER — PROGESTERONE MICRONIZED 100 MG PO CAPS: 100.0000 mg | ORAL_CAPSULE | Freq: Every evening | ORAL | 11 refills | Status: AC

## 2024-03-20 MED ORDER — ESTRADIOL 0.05 MG/24HR TD PTTW
1.0000 | MEDICATED_PATCH | TRANSDERMAL | 12 refills | Status: DC
Start: 2024-03-22 — End: 2024-04-10

## 2024-03-20 NOTE — Patient Instructions (Signed)
 Preventive Care 58-59 Years Old, Female  Preventive care refers to lifestyle choices and visits with your health care provider that can promote health and wellness. Preventive care visits are also called wellness exams.  What can I expect for my preventive care visit?  Counseling  Your health care provider may ask you questions about your:  Medical history, including:  Past medical problems.  Family medical history.  Pregnancy history.  Current health, including:  Menstrual cycle.  Method of birth control.  Emotional well-being.  Home life and relationship well-being.  Sexual activity and sexual health.  Lifestyle, including:  Alcohol, nicotine or tobacco, and drug use.  Access to firearms.  Diet, exercise, and sleep habits.  Work and work Astronomer.  Sunscreen use.  Safety issues such as seatbelt and bike helmet use.  Physical exam  Your health care provider will check your:  Height and weight. These may be used to calculate your BMI (body mass index). BMI is a measurement that tells if you are at a healthy weight.  Waist circumference. This measures the distance around your waistline. This measurement also tells if you are at a healthy weight and may help predict your risk of certain diseases, such as type 2 diabetes and high blood pressure.  Heart rate and blood pressure.  Body temperature.  Skin for abnormal spots.  What immunizations do I need?    Vaccines are usually given at various ages, according to a schedule. Your health care provider will recommend vaccines for you based on your age, medical history, and lifestyle or other factors, such as travel or where you work.  What tests do I need?  Screening  Your health care provider may recommend screening tests for certain conditions. This may include:  Lipid and cholesterol levels.  Diabetes screening. This is done by checking your blood sugar (glucose) after you have not eaten for a while (fasting).  Pelvic exam and Pap test.  Hepatitis B test.  Hepatitis C  test.  HIV (human immunodeficiency virus) test.  STI (sexually transmitted infection) testing, if you are at risk.  Lung cancer screening.  Colorectal cancer screening.  Mammogram. Talk with your health care provider about when you should start having regular mammograms. This may depend on whether you have a family history of breast cancer.  BRCA-related cancer screening. This may be done if you have a family history of breast, ovarian, tubal, or peritoneal cancers.  Bone density scan. This is done to screen for osteoporosis.  Talk with your health care provider about your test results, treatment options, and if necessary, the need for more tests.  Follow these instructions at home:  Eating and drinking    Eat a diet that includes fresh fruits and vegetables, whole grains, lean protein, and low-fat dairy products.  Take vitamin and mineral supplements as recommended by your health care provider.  Do not drink alcohol if:  Your health care provider tells you not to drink.  You are pregnant, may be pregnant, or are planning to become pregnant.  If you drink alcohol:  Limit how much you have to 0-1 drink a day.  Know how much alcohol is in your drink. In the [place] one drink equals one 12 oz bottle of beer (355 mL), one 5 oz glass of wine (148 mL), or one 1 oz glass of hard liquor (44 mL).  Lifestyle  Brush your teeth every morning and night with fluoride toothpaste. Floss one time each day.  Exercise for at least  30 minutes 5 or more days each week.  Do not use any products that contain nicotine or tobacco. These products include cigarettes, chewing tobacco, and vaping devices, such as e-cigarettes. If you need help quitting, ask your health care provider.  Do not use drugs.  If you are sexually active, practice safe sex. Use a condom or other form of protection to prevent STIs.  If you do not wish to become pregnant, use a form of birth control. If you plan to become pregnant, see your health care provider for a  prepregnancy visit.  Take aspirin only as told by your health care provider. Make sure that you understand how much to take and what form to take. Work with your health care provider to find out whether it is safe and beneficial for you to take aspirin daily.  Find healthy ways to manage stress, such as:  Meditation, yoga, or listening to music.  Journaling.  Talking to a trusted person.  Spending time with friends and family.  Minimize exposure to UV radiation to reduce your risk of skin cancer.  Safety  Always wear your seat belt while driving or riding in a vehicle.  Do not drive:  If you have been drinking alcohol. Do not ride with someone who has been drinking.  When you are tired or distracted.  While texting.  If you have been using any mind-altering substances or drugs.  Wear a helmet and other protective equipment during sports activities.  If you have firearms in your house, make sure you follow all gun safety procedures.  Seek help if you have been physically or sexually abused.  What's next?  Visit your health care provider once a year for an annual wellness visit.  Ask your health care provider how often you should have your eyes and teeth checked.  Stay up to date on all vaccines.  This information is not intended to replace advice given to you by your health care provider. Make sure you discuss any questions you have with your health care provider.  Document Revised: 01/21/2021 Document Reviewed: 01/21/2021  Elsevier Patient Education  2024 [employer].

## 2024-03-20 NOTE — Progress Notes (Signed)
 Maria Rodriguez [facility] 1966/06/16 [id number]   History: Postmenopausal 58 y.o. presents for annual exam. C/o trouble losing weight especially in the midsection. Occasional joint pain, hx of osteopenia. Tried vaginal estrogen a few years ago, did not think she saw any improvement.    Gynecologic History Postmenopausal Last Pap: 02/2018. Results were: normal Last mammogram: 09/08/23. Results were: normal Last colonoscopy: 03/22/23 DEXA: 2019 osteopenia  Obstetric History OB History  Gravida Para Term Preterm AB Living  2 2    2   SAB IAB Ectopic Multiple Live Births      2    # Outcome Date GA Lbr Len/2nd Weight Sex Type Anes PTL Lv  2 Para           1 Para                03/20/2024    9:43 AM 02/09/2024    1:21 PM 11/12/2021    3:39 PM  Depression screen PHQ 2/9  Decreased Interest 0 0 0  Down, Depressed, Hopeless 0 0 0  PHQ - 2 Score 0 0 0  Altered sleeping  0   Tired, decreased energy  0   Change in appetite  0   Feeling bad or failure about yourself   0   Trouble concentrating  0   Moving slowly or fidgety/restless  0   Suicidal thoughts  0   PHQ-9 Score  0   Difficult doing work/chores   Not difficult at all     The following portions of the patient's history were reviewed and updated as appropriate: allergies, current medications, past family history, past medical history, past social history, past surgical history, and problem list.  Review of Systems Pertinent items noted in HPI and remainder of comprehensive ROS otherwise negative.  Past medical history, past surgical history, family history and social history were all reviewed and documented in the EPIC chart.  Exam:  Vitals:   03/20/24 0941  BP: 102/66  Pulse: 81  SpO2: 97%  Weight: 141 lb (64 kg)  Height: 5' 4.75 (1.645 m)   Body mass index is 23.65 kg/m.  General appearance:  Normal Thyroid:  Symmetrical, normal in size, without palpable masses or nodularity. Respiratory  Auscultation:  Clear  without wheezing or rhonchi Cardiovascular  Auscultation:  Regular rate, without rubs, murmurs or gallops  Edema/varicosities:  Not grossly evident Abdominal  Soft,nontender, without masses, guarding or rebound.  Liver/spleen:  No organomegaly noted  Hernia:  None appreciated  Skin  Inspection:  Grossly normal Breasts: Examined lying and sitting.   Right: Without masses, retractions, nipple discharge or axillary adenopathy.   Left: Without masses, retractions, nipple discharge or axillary adenopathy. Genitourinary   Inguinal/mons:  Normal without inguinal adenopathy  External genitalia:  Normal appearing vulva with no masses, tenderness, or lesions  BUS/Urethra/Skene's glands:  Normal  Vagina:  Normal appearing with normal color and discharge, no lesions. Atrophy: mild   Cervix:  Normal appearing without discharge or lesions  Uterus:  Normal in size, shape and contour.  Midline and mobile, nontender  Adnexa/parametria:     Rt: Normal in size, without masses or tenderness.   Lt: Normal in size, without masses or tenderness.  Anus and perineum: Normal    Darice Hoit, CMA present for exam  Assessment/Plan:   1. Well woman exam with routine gynecological exam (Primary) - Cytology - PAP( [place])  2. Osteopenia after menopause - DG Bone Density; Future  3. Menopausal symptoms Risks and benefits  reviewed.  - estradiol  (VIVELLE -DOT) 0.05 MG/24HR patch; Place 1 patch (0.05 mg total) onto the skin 2 (two) times a week.  Dispense: 8 patch; Refill: 12 - progesterone  (PROMETRIUM ) 100 MG capsule; Take 1 capsule (100 mg total) by mouth at bedtime.  Dispense: 30 capsule; Refill: 11  4. Depression screen    Return in 1 year for annual or sooner prn.  Azrael Maddix B WHNP-BC, 9:49 AM 03/20/2024

## 2024-03-23 ENCOUNTER — Ambulatory Visit: Payer: Self-pay | Admitting: Radiology

## 2024-03-23 LAB — CYTOLOGY - PAP
Adequacy: ABSENT
Comment: NEGATIVE
Diagnosis: NEGATIVE
High risk HPV: NEGATIVE

## 2024-04-09 ENCOUNTER — Other Ambulatory Visit: Payer: Self-pay | Admitting: Radiology

## 2024-04-09 DIAGNOSIS — N951 Menopausal and female climacteric states: Secondary | ICD-10-CM

## 2024-04-10 NOTE — Telephone Encounter (Signed)
 Med refill request: vivelle  patch  Last AEX: 03/20/24 Next AEX: not scheduled  Last MMG (if hormonal med) 03/07/24 birads cat 1 neg  Refill authorized: Pharmacy requesting 90 day supply with new DX code. Please advise. Last rx 03/22/24 #8 with 12 refills.

## 2024-06-01 ENCOUNTER — Other Ambulatory Visit: Payer: Self-pay | Admitting: Internal Medicine

## 2024-06-01 DIAGNOSIS — E559 Vitamin D deficiency, unspecified: Secondary | ICD-10-CM
